# Patient Record
Sex: Female | Born: 1945 | Race: White | Hispanic: No | Marital: Married | Smoking: Never smoker
Health system: Southern US, Community
[De-identification: ages and names within clinical notes are randomized; demographics above are authoritative.]

## PROBLEM LIST (undated history)

## (undated) DIAGNOSIS — K219 Gastro-esophageal reflux disease without esophagitis: Secondary | ICD-10-CM

## (undated) HISTORY — DX: Gastro-esophageal reflux disease without esophagitis: K21.9

## (undated) HISTORY — PX: ABDOMINAL HYSTERECTOMY: SHX81

## (undated) HISTORY — PX: TUBAL LIGATION: SHX77

## (undated) HISTORY — PX: LAMINECTOMY: SHX219

## (undated) HISTORY — PX: OTHER SURGICAL HISTORY: SHX169

---

## 2004-12-17 ENCOUNTER — Encounter: Admission: RE | Admit: 2004-12-17 | Discharge: 2004-12-17 | Payer: Self-pay | Admitting: Internal Medicine

## 2005-12-08 ENCOUNTER — Encounter: Admission: RE | Admit: 2005-12-08 | Discharge: 2005-12-08 | Payer: Self-pay | Admitting: Family Medicine

## 2005-12-13 ENCOUNTER — Emergency Department (HOSPITAL_COMMUNITY): Admission: EM | Admit: 2005-12-13 | Discharge: 2005-12-13 | Payer: Self-pay | Admitting: Emergency Medicine

## 2005-12-14 ENCOUNTER — Encounter: Payer: Self-pay | Admitting: Emergency Medicine

## 2005-12-14 ENCOUNTER — Inpatient Hospital Stay (HOSPITAL_COMMUNITY): Admission: EM | Admit: 2005-12-14 | Discharge: 2005-12-17 | Payer: Self-pay | Admitting: Orthopedic Surgery

## 2008-05-13 IMAGING — CT CT L SPINE W/O CM
1 of 2 series · 2 of 14 positions shown, 3 images · IV contrast (agent unspecified)
Comparison: Right hip MR dated 12/08/2005.

CLINICAL DATA: Low back and right hip pain for one month. No known injury.

LUMBAR SPINE CT WITHOUT CONTRAST:
TECHNIQUE: Multidetector CT imaging of the lumbar spine was performed. 
Sagittal and coronal plane reformatted images were reconstructed from the axial
CT data, and were also reviewed.
TECHNIQUE: Multidetector CT imaging was performed according to the standard
protocol.  Sagittal and coronal plane reformatted images were reconstructed from
the axial CT data, and were also reviewed.

[Series 3: pelvis_hip 3.0 b60f st · axial · 0.41mm/px · z∈[-648,-486]mm · 2 of 55 slices shown, 3 images]
[im 1/55  soft-tissue]
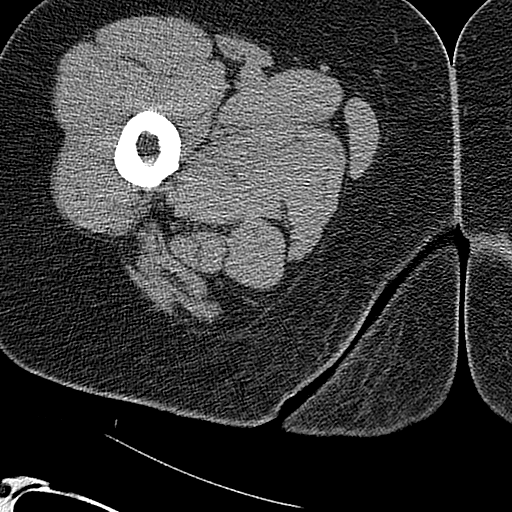
[im 1/55  bone]
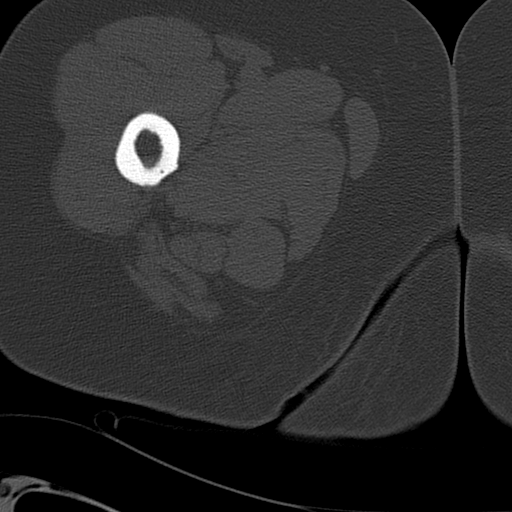
[im 55/55  bone]
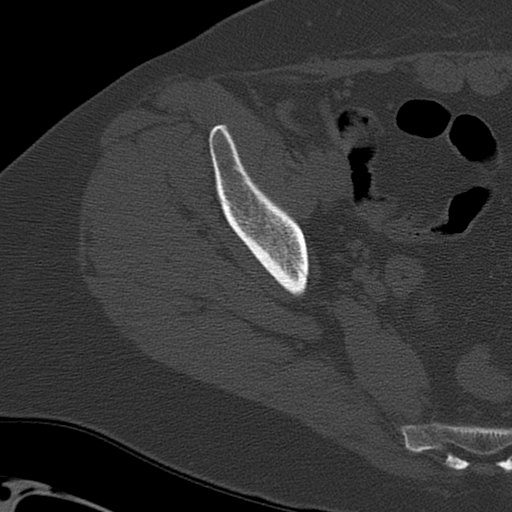

[2 of 14 positions shown; findings below may reference images not displayed]

FINDINGS: Five nonrib-bearing lumbar vertebrae.

L1-2:  Minimal diffuse disc bulging and mild bilateral facet hypertrophy.

L2-3:  Mild to moderate diffuse disc bulging and mild bilateral facet
hypertrophy without significant canal or foraminal stenosis. Vacuum phenomenon
in the disc.

L3-4:  Mild to moderate diffuse disc bulging and minimal bilateral facet
hypertrophy, with mild foraminal stenosis on the right.

L4-5:  Moderate disc space narrowing, moderate diffuse disc bulging, moderate
facet hypertrophy on the left and mild facet hypertrophy on the right. Large
foraminal disc herniation on the right, extending lateral and inferior to the
foramen, with posterior displacement flattening of the right L4 nerve root. The
nerve root cannot be clearly separated from the disc herniation. Moderate left
and mild right facet hypertrophy.  Moderate diffuse disc bulging. 

L5-S1:  Small to moderate sized disc herniation on the left, causing a mild
anterior indentation on the thecal sac. Moderate left and mild right facet
hypertrophy, without significant canal or foraminal stenosis.
IMPRESSION: 1. Large foraminal and extraforaminal disc herniation on the right, at the L4-L5
level, compressing the right L4 nerve root. The nerve root could be better
delineated with magnetic resonance imaging and that would also be useful for
excluding a neural tumor, mimicking a disc herniation.

2. Multilevel degenerative changes, as described above. 

CT OF THE RIGHT HIP WITHOUT CONTRAST
FINDINGS: Stable normal appearing right hip. The remainder of the examination
is unremarkable.

IMPRESSION

Stable normal appearing right hip.

## 2009-10-22 IMAGING — US MAMMO-LUNI-US
1 series · 10 of 10 positions shown · non-contrast
Comparison: NONE

CLINICAL DATA: SOLHEY.Au RT(R)(M)(CT)   Diagnostic Mammogram.  
Left  breast nodule demonstrated on screening mammogram dated 
05/18/2007.  

LEFT BREAST MAMMOGRAM ADDITIONAL VIEWS AND LEFT BREAST ULTRASOUND

[Series 1: us breast · 0.09mm/px · 10 of 10 slices shown]
[im 1/10]
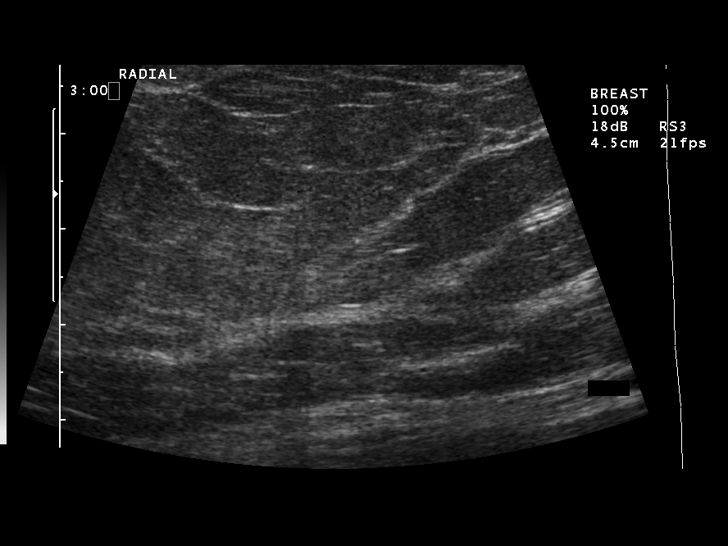
[im 2/10]
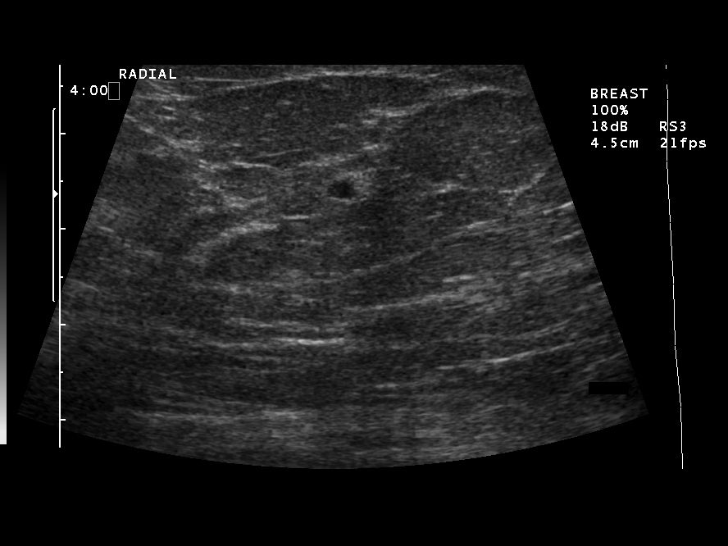
[im 3/10]
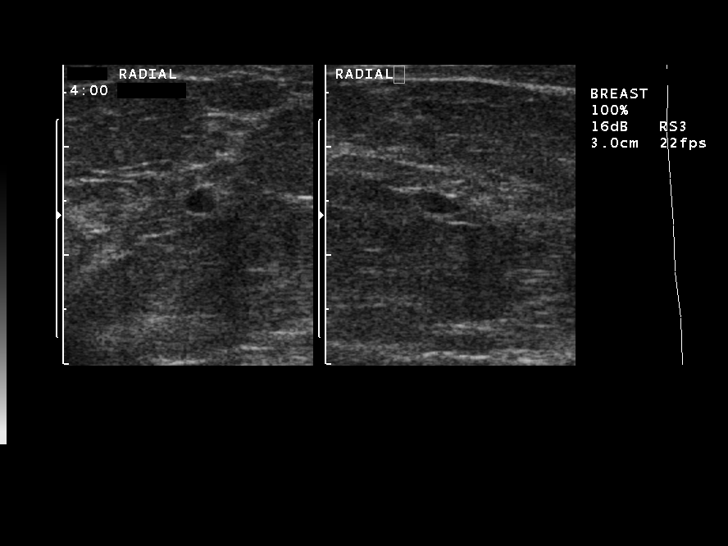
[im 4/10]
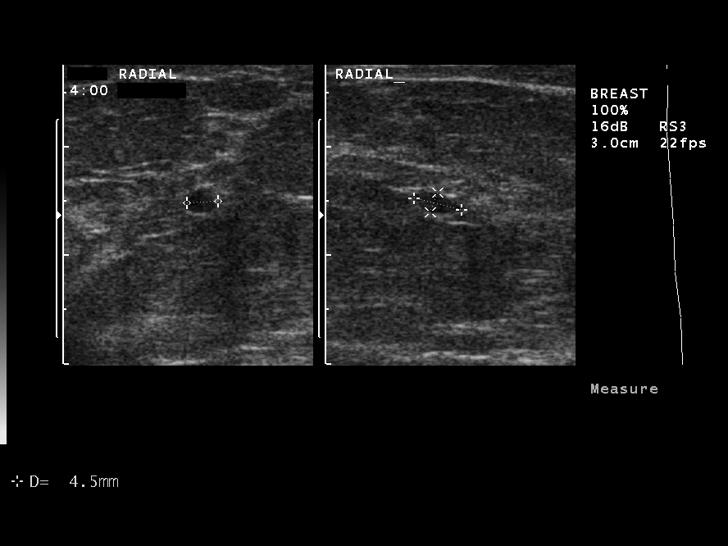
[im 5/10]
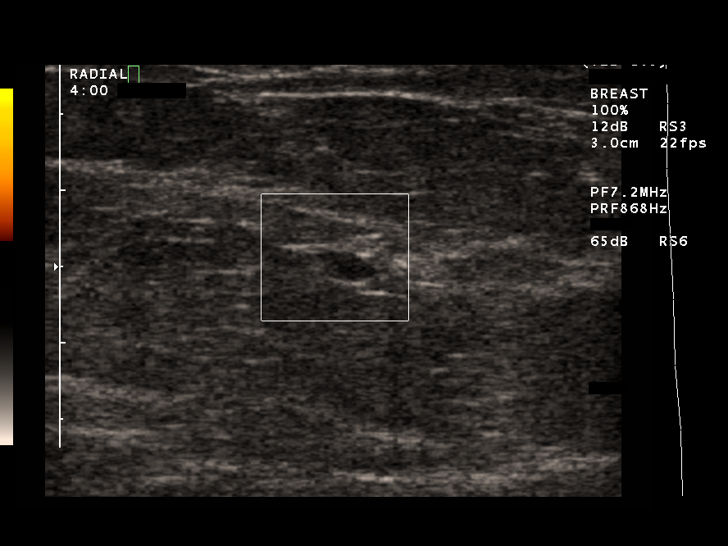
[im 6/10]
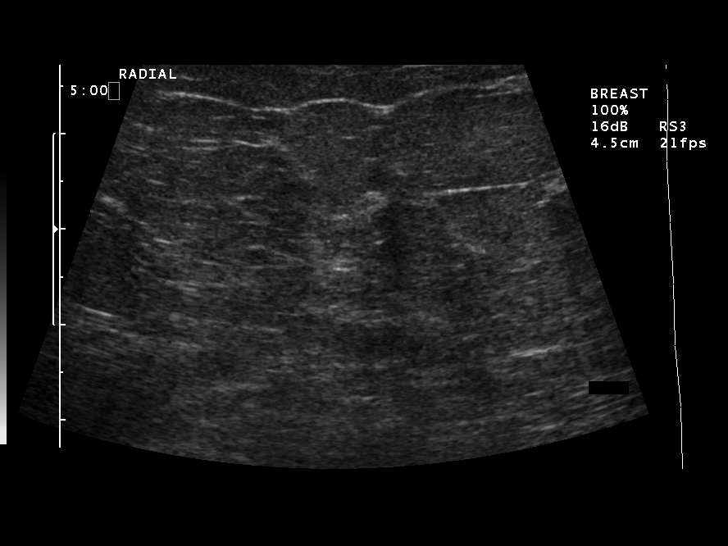
[im 7/10]
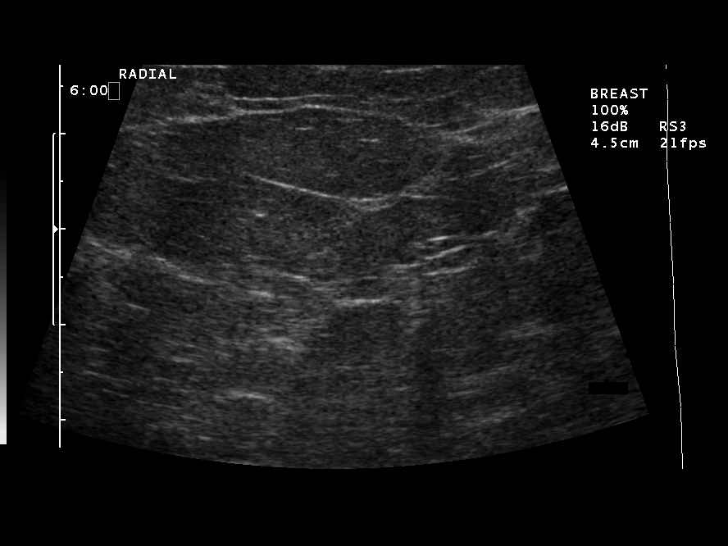
[im 8/10]
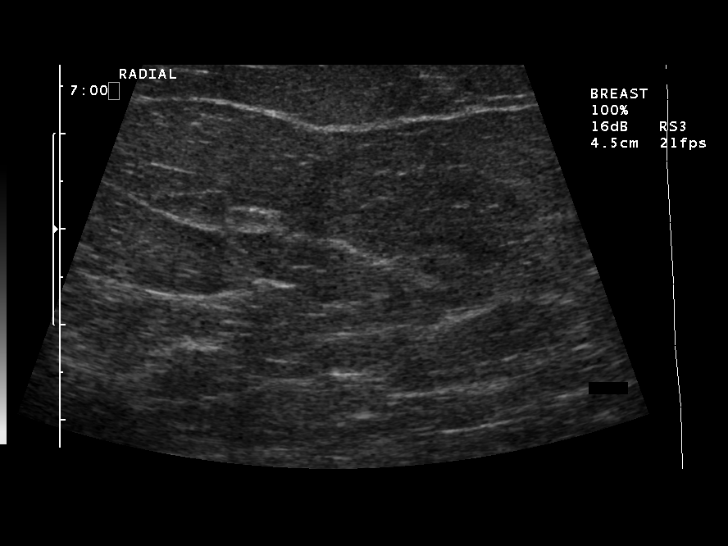
[im 9/10]
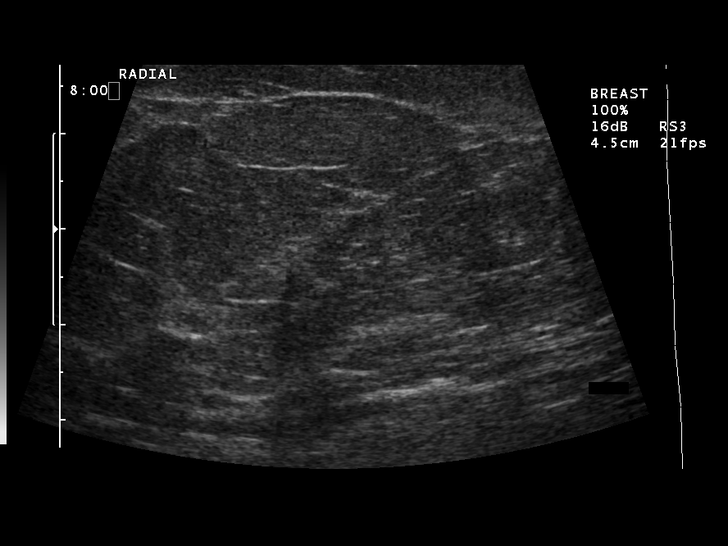
[im 10/10]
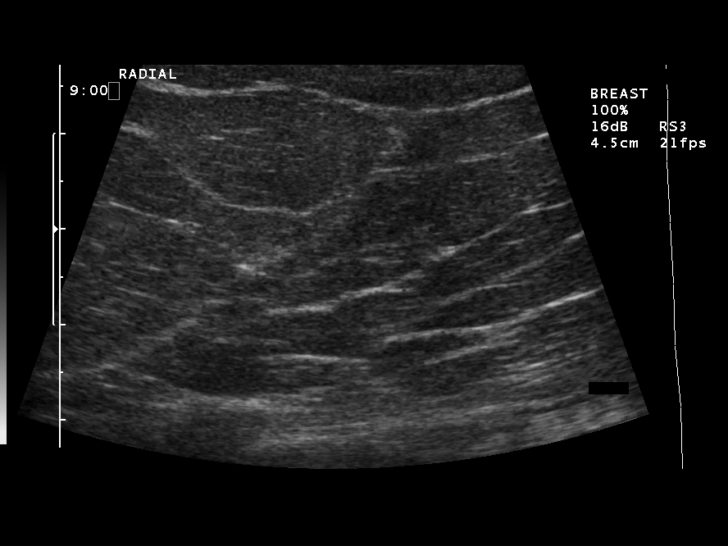

[10 of 10 positions shown; findings below may reference images not displayed]

FINDINGS: Repeat left CC view and true lateral view of the left 
breast demonstrates a sub-centimeter low-density lesion 
approximately 4 to 5mm in size in the lower outer quadrant of the 
left breast. Ultrasound demonstrates a tiny cyst measuring 4.5 x 
1.9 x 2.9 mm.
IMPRESSION: Benign cyst in the left breast.  Bilateral mammogram 
in one year is recommended. The patient was informed at the time 
of the examination of the findings and recommendations by verbal 
and written lay report. Computer assisted (Second Look) technology 
was used as an aid in interpretation of this study. BI-RADS 2- 
05/25/2007 Dict Date: 05/25/2007  Tran Date:  05/25/2007 PAKYAN FOUNG

## 2013-05-24 ENCOUNTER — Emergency Department (HOSPITAL_BASED_OUTPATIENT_CLINIC_OR_DEPARTMENT_OTHER)
Admission: EM | Admit: 2013-05-24 | Discharge: 2013-05-24 | Discharge status: Against Medical Advice | Payer: Medicare Other | Attending: Emergency Medicine | Admitting: Emergency Medicine

## 2013-05-24 DIAGNOSIS — R109 Unspecified abdominal pain: Secondary | ICD-10-CM | POA: Insufficient documentation

## 2013-05-24 DIAGNOSIS — R52 Pain, unspecified: Secondary | ICD-10-CM | POA: Insufficient documentation

## 2013-05-24 LAB — URINALYSIS, ROUTINE W REFLEX MICROSCOPIC
Bilirubin Urine: NEGATIVE
Glucose, UA: NEGATIVE mg/dL
HGB URINE DIPSTICK: NEGATIVE
Ketones, ur: NEGATIVE mg/dL
Nitrite: NEGATIVE
Protein, ur: NEGATIVE mg/dL
SPECIFIC GRAVITY, URINE: 1.013 (ref 1.005–1.030)
UROBILINOGEN UA: 0.2 mg/dL (ref 0.0–1.0)
pH: 6 (ref 5.0–8.0)

## 2013-05-24 LAB — URINE MICROSCOPIC-ADD ON

## 2013-05-24 NOTE — ED Notes (Signed)
Pt notified registration staff that she was leaving.  Was not willing to wait any longer.  Left with her husband.

## 2013-05-24 NOTE — ED Notes (Signed)
Pt denotes acute right sided abdominal pain that developed acutely around 1700 and has gotten worse throughout night

## 2013-08-04 ENCOUNTER — Encounter: Payer: Self-pay | Admitting: Podiatrist

## 2013-08-04 ENCOUNTER — Ambulatory Visit (INDEPENDENT_AMBULATORY_CARE_PROVIDER_SITE_OTHER): Payer: Medicare Other | Admitting: Podiatrist

## 2013-08-04 VITALS — BP 134/76 | HR 86 | Resp 18

## 2013-08-04 DIAGNOSIS — M6740 Ganglion, unspecified site: Secondary | ICD-10-CM

## 2013-08-04 NOTE — Patient Instructions (Signed)

## 2013-08-05 NOTE — Progress Notes (Signed)
Chief Complaint  Patient presents with  . Foot Problem    DR REGAL GIVES ME AN INJECTION ON MY RIGHT FOOT AND IT HAS PUFFED UP A LITTLE WE HAVE BEEN MOVING     HPI: Patient is 68 y.o. female who presents today for pain on the lateral side of the right foot.  She has had injections in the cyst in the past and states it has helped.  She has been moving recently and her foot has flared up on her.    Objective:   Neurovascular status intact.  Soft palpable cyst is present on the lateral side of the foot at the cuboid region.  No redness or ecchymosis is present.    Assessment: soft tissue cyst right foot.  Plan: injected the cyst with kenalog and marcaine plain.  Applied an compressive dressing.  She will be seen back as needed for follow up.  Briefly discussed surgical excision if the cyst continues to return or give her problems.

## 2013-12-02 ENCOUNTER — Ambulatory Visit: Payer: Medicare Other | Attending: Sports Medicine | Admitting: Occupational Therapy

## 2015-10-20 ENCOUNTER — Ambulatory Visit (INDEPENDENT_AMBULATORY_CARE_PROVIDER_SITE_OTHER): Payer: Medicare Other | Admitting: Podiatry

## 2015-10-20 ENCOUNTER — Ambulatory Visit (INDEPENDENT_AMBULATORY_CARE_PROVIDER_SITE_OTHER): Payer: Medicare Other

## 2015-10-20 ENCOUNTER — Encounter: Payer: Self-pay | Admitting: Podiatry

## 2015-10-20 DIAGNOSIS — M79672 Pain in left foot: Secondary | ICD-10-CM

## 2015-10-20 DIAGNOSIS — M779 Enthesopathy, unspecified: Secondary | ICD-10-CM

## 2015-10-20 DIAGNOSIS — M79671 Pain in right foot: Secondary | ICD-10-CM

## 2015-10-20 DIAGNOSIS — M2041 Other hammer toe(s) (acquired), right foot: Secondary | ICD-10-CM | POA: Diagnosis not present

## 2015-10-20 MED ORDER — TRIAMCINOLONE ACETONIDE 10 MG/ML IJ SUSP
10.0000 mg | Freq: Once | INTRAMUSCULAR | Status: AC
  Administered 2015-10-20: 10 mg

## 2015-10-20 NOTE — Progress Notes (Signed)
Subjective:     Patient ID: Gwendolyn Holland, female   DOB: 27-Jan-1945, 70 y.o.   MRN: [id number]  HPI patient states I'm developing a lot of pain in the outside of my right foot and my second toe does have a hammertoe and can become bothersome   Review of Systems     Objective:   Physical Exam Neurovascular status intact with quite a bit of tendinitis inflammation around the peroneal group as it inserts into base of fifth metatarsal right and also is noted to have rigid contracture second digit right with redness at the interphalangeal joint    Assessment:     Hammertoe deformity with rigid contracture digit 2 right along with tendinitis lateral side right foot    Plan:     H&P condition reviewed and padding applied a second toe with discussion of digital fusion and injected the tendon complex right 3 mg Kenalog 5 mg Xylocaine

## 2015-10-20 NOTE — Progress Notes (Signed)
   Subjective:    Patient ID: Gwendolyn Holland, female    DOB: 10-30-1945, 70 y.o.   MRN: [id number]  HPI  Chief Complaint  Patient presents with  . Foot Pain    R foot lump/lateral dorsal "comes every fall"    . Toe Pain    R 2nd toe, sore, red and painful x yrs.         Review of Systems  All other systems reviewed and are negative.      Objective:   Physical Exam        Assessment & Plan:
